# Patient Record
Sex: Male | Born: 1995 | Race: White | Hispanic: No | Marital: Single | Smoking: Current every day smoker
Health system: Southern US, Community
[De-identification: ages and names within clinical notes are randomized; demographics above are authoritative.]

## PROBLEM LIST (undated history)

## (undated) DIAGNOSIS — K5090 Crohn's disease, unspecified, without complications: Secondary | ICD-10-CM

## (undated) HISTORY — PX: FOREARM FRACTURE SURGERY: SHX649

---
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
## 2016-10-07 ENCOUNTER — Emergency Department (HOSPITAL_BASED_OUTPATIENT_CLINIC_OR_DEPARTMENT_OTHER)
Admission: EM | Admit: 2016-10-07 | Discharge: 2016-10-07 | Disposition: A | Discharge status: Home / Self Care | Payer: Self-pay | Attending: Emergency Medicine | Admitting: Emergency Medicine

## 2016-10-07 ENCOUNTER — Encounter (HOSPITAL_BASED_OUTPATIENT_CLINIC_OR_DEPARTMENT_OTHER): Payer: Self-pay | Admitting: *Deleted

## 2016-10-07 DIAGNOSIS — M791 Myalgia, unspecified site: Secondary | ICD-10-CM

## 2016-10-07 DIAGNOSIS — M2550 Pain in unspecified joint: Secondary | ICD-10-CM | POA: Insufficient documentation

## 2016-10-07 DIAGNOSIS — F17210 Nicotine dependence, cigarettes, uncomplicated: Secondary | ICD-10-CM | POA: Insufficient documentation

## 2016-10-07 HISTORY — DX: Crohn's disease, unspecified, without complications: K50.90

## 2016-10-07 MED ORDER — KETOROLAC TROMETHAMINE 60 MG/2ML IM SOLN: 15.0000 mg | Freq: Once | INTRAMUSCULAR | Status: DC

## 2016-10-07 MED ORDER — CYCLOBENZAPRINE HCL 10 MG PO TABS: 10.0000 mg | ORAL_TABLET | Freq: Two times a day (BID) | ORAL | 0 refills | Status: AC | PRN

## 2016-10-07 MED ORDER — KETOROLAC TROMETHAMINE 15 MG/ML IJ SOLN
15.0000 mg | Freq: Once | INTRAMUSCULAR | Status: AC
Start: 2016-10-07 — End: 2016-10-07
  Administered 2016-10-07: 15 mg via INTRAMUSCULAR
  Filled 2016-10-07: qty 1

## 2016-10-07 MED FILL — CYCLOBENZAPRINE 10 MG TAB: 10 | 3 days supply | Qty: 7 | Fill #0

## 2016-10-07 NOTE — Discharge Instructions (Signed)
You have [facility], please look on your Medicaid card and he will find the name of your primary care doctor. Call your primary care doctor and make an appointment to be seen as soon as possible. If you [facility] is not active you can establish primary care using resource guide below.   Starting tomorrow you can start taking ibuprofen tomorrow, do not have any today because you have had a shot today.  For pain control please take Ibuprofen (also known as Motrin or Advil)  (this is normally 2 over the counter pills) every 6 hours. Take with food to minimize stomach irritation. and acetaminophen  (this is 3 over the counter pills) four times a day. Do not drink alcohol or combine with other medications that have acetaminophen as an ingredient (Read the labels!).    For breakthrough pain you may take Flexeril. Do not drink alcohol, drive or operate heavy machinery when taking Flexeril.  Do not hesitate to return to the emergency room for any new, worsening or concerning symptoms.

## 2016-10-07 NOTE — ED Triage Notes (Signed)
Pt reports generalized pain in muscles and joints for 1-2 weeks. Denies recent falls, injuries, or illness. Pt states he feels weak when doing ADLS. States he is unable to sleep due to pain. Has not tried using OTC pain meds, States unable to take NSAIDS due to having Crohns

## 2016-10-07 NOTE — ED Notes (Signed)
Pt directed to pharmacy to pick up Rx 

## 2016-10-07 NOTE — ED Provider Notes (Signed)
[facility]   CSN: [id number] Arrival date & time: 10/07/16  1025     History   Chief Complaint Chief Complaint  Patient presents with  . Joint Pain    HPI  Blood pressure 122/82, pulse 80, temperature 100 F (37.8 C), temperature source Oral, resp. rate 16, height  (1.727 m), weight 54.4 kg (120 lb), SpO2 99 %.  Danny Orozco is a 21 y.o. male complaining of Pain to all muscles in all joints in his body, when pressed he states that the pain is worse in the knees, he rates his pain at 10 out of 10, no exacerbating or alleviating factors, described as aching and sharp. This started 2 weeks ago. He states that the pain is so severe it's keeping him from sleep at night. He denies fevers, chills, nausea, vomiting, change in bowel or bladder habits, no exacerbation of his Crohn's disease. He has not tried any pain medication prior to arrival. There is been no recent trauma. He states that he hasn't tried any over-the-counter pain medications because he knows this won't help. Denies any urethritis.   Past Medical History:  Diagnosis Date  . Crohn's disease (HCC)     There are no active problems to display for this patient.   Past Surgical History:  Procedure Laterality Date  . FOREARM FRACTURE SURGERY         Home Medications    Prior to Admission medications   Medication Sig Start Date End Date Taking? Authorizing Provider  cyclobenzaprine (FLEXERIL) 10 MG tablet Take 1 tablet (10 mg total) by mouth 2 (two) times daily as needed for muscle spasms. 10/07/16   Jodene Polyak, Mardella Layman    Family History No family history on file.  Social History Social History  Substance Use Topics  . Smoking status: Current Every Day Smoker    Types: Cigarettes  . Smokeless tobacco: Former Neurosurgeon  . Alcohol use Yes     Comment: 1x month     Allergies   Patient has no known allergies.   Review of Systems Review of Systems  A complete review of  systems was obtained and all systems are negative except as noted in the HPI and PMH.    Physical Exam Updated Vital Signs BP 122/82 (BP Location: Right Arm)   Pulse 80   Temp 100 F (37.8 C) (Oral)   Resp 16   Ht  (1.727 m)   Wt 54.4 kg (120 lb)   SpO2 99%   BMI 18.25 kg/m   Physical Exam  Constitutional: He is oriented to person, place, and time. He appears well-developed and well-nourished. No distress.  HENT:  Head: Normocephalic and atraumatic.  Mouth/Throat: Oropharynx is clear and moist.  Eyes: Pupils are equal, round, and reactive to light. Conjunctivae and EOM are normal.  Neck: Normal range of motion.  Cardiovascular: Normal rate, regular rhythm and intact distal pulses.   Pulmonary/Chest: Effort normal and breath sounds normal.  Abdominal: Soft. He exhibits no distension and no mass. There is no tenderness. There is no rebound and no guarding. No hernia.  Musculoskeletal: Normal range of motion. He exhibits no edema, tenderness or deformity.  Full active range of motion to all joints. No effusions, no warmth. No signs of infection. No focal tenderness  Neurological: He is alert and oriented to person, place, and time.  Skin: Capillary refill takes less than 2 seconds. He is not diaphoretic.  Psychiatric: He has a normal mood  and affect.  Nursing note and vitals reviewed.    ED Treatments / Results  Labs (all labs ordered are listed, but only abnormal results are displayed) Labs Reviewed - No data to display  EKG  EKG Interpretation None       Radiology No results found.  Procedures Procedures (including critical care time)  Medications Ordered in ED Medications  ketorolac (TORADOL) injection 15 mg (not administered)     Initial Impression / Assessment and Plan / ED Course  I have reviewed the triage vital signs and the nursing notes.  Pertinent labs & imaging results that were available during my care of the patient were reviewed by me and  considered in my medical decision making (see chart for details).     Vitals:   10/07/16 1045 10/07/16 1046  BP:  122/82  Pulse:  80  Resp:  16  Temp:  100 F (37.8 C)  TempSrc:  Oral  SpO2:  99%  Weight: 54.4 kg (120 lb)   Height:  (1.727 m)     Medications  ketorolac (TORADOL) injection 15 mg (not administered)    Danny Orozco is 21 y.o. male presenting with Diffuse myalgia in addition to joint pain, patient afebrile, nontoxic appearing. Full active range of motion to all joints. No signs of infection. Abdominal exam benign. Patient will be given a shot of Toradol in the ED, I've advised him that he will need to follow closely with primary care, advised him how to establish primary care using his Medicaid card. He'll be written a prescription for muscle relaxers.  Evaluation does not show pathology that would require ongoing emergent intervention or inpatient treatment. Pt is hemodynamically stable and mentating appropriately. Discussed findings and plan with patient/guardian, who agrees with care plan. All questions answered. Return precautions discussed and outpatient follow up given.      Final Clinical Impressions(s) / ED Diagnoses   Final diagnoses:  Myalgia  Arthralgia, unspecified joint    New Prescriptions New Prescriptions   CYCLOBENZAPRINE (FLEXERIL) 10 MG TABLET    Take 1 tablet (10 mg total) by mouth 2 (two) times daily as needed for muscle spasms.     Kaylyn Lim 10/07/16 1127    Vanetta Mulders, MD 10/08/16 [id number]
